# Patient Record
Sex: Male | Born: 1995 | Race: Black or African American | Hispanic: No | Marital: Single
Health system: Southern US, Community
[De-identification: ages and names within clinical notes are randomized; demographics above are authoritative.]

---

## 2016-10-21 ENCOUNTER — Emergency Department (HOSPITAL_COMMUNITY)
Admission: EM | Admit: 2016-10-21 | Discharge: 2016-10-21 | Disposition: A | Discharge status: Home / Self Care | Payer: BC Managed Care – PPO | Attending: Emergency Medicine | Admitting: Emergency Medicine

## 2016-10-21 ENCOUNTER — Emergency Department (HOSPITAL_COMMUNITY): Payer: BC Managed Care – PPO

## 2016-10-21 ENCOUNTER — Encounter (HOSPITAL_COMMUNITY): Payer: Self-pay

## 2016-10-21 DIAGNOSIS — S50812A Abrasion of left forearm, initial encounter: Secondary | ICD-10-CM | POA: Insufficient documentation

## 2016-10-21 DIAGNOSIS — M545 Low back pain, unspecified: Secondary | ICD-10-CM

## 2016-10-21 DIAGNOSIS — Y9339 Activity, other involving climbing, rappelling and jumping off: Secondary | ICD-10-CM | POA: Diagnosis not present

## 2016-10-21 DIAGNOSIS — W19XXXA Unspecified fall, initial encounter: Secondary | ICD-10-CM

## 2016-10-21 DIAGNOSIS — W1839XA Other fall on same level, initial encounter: Secondary | ICD-10-CM | POA: Insufficient documentation

## 2016-10-21 DIAGNOSIS — Y92219 Unspecified school as the place of occurrence of the external cause: Secondary | ICD-10-CM | POA: Insufficient documentation

## 2016-10-21 DIAGNOSIS — M79642 Pain in left hand: Secondary | ICD-10-CM

## 2016-10-21 DIAGNOSIS — Y999 Unspecified external cause status: Secondary | ICD-10-CM | POA: Diagnosis not present

## 2016-10-21 DIAGNOSIS — M25532 Pain in left wrist: Secondary | ICD-10-CM | POA: Diagnosis not present

## 2016-10-21 DIAGNOSIS — M25552 Pain in left hip: Secondary | ICD-10-CM | POA: Diagnosis not present

## 2016-10-21 DIAGNOSIS — S59912A Unspecified injury of left forearm, initial encounter: Secondary | ICD-10-CM | POA: Diagnosis present

## 2016-10-21 DIAGNOSIS — S60512A Abrasion of left hand, initial encounter: Secondary | ICD-10-CM | POA: Insufficient documentation

## 2016-10-21 MED ORDER — METHOCARBAMOL 500 MG PO TABS: 500.0000 mg | ORAL_TABLET | Freq: Two times a day (BID) | ORAL | 0 refills | Status: AC

## 2016-10-21 MED ORDER — TRAMADOL HCL 50 MG PO TABS
50.0000 mg | ORAL_TABLET | Freq: Once | ORAL | Status: AC
  Administered 2016-10-21: 50 mg via ORAL
  Filled 2016-10-21: qty 1

## 2016-10-21 NOTE — ED Provider Notes (Signed)
Emergency Department Provider Note   I have reviewed the triage vital signs and the nursing notes.   HISTORY  Chief Complaint Fall   HPI Alexander Wood is a 20 y.o. male with no PMH presents to the emergency department for evaluation ofleft wrist, left hip, and lower back pain after mechanical fall. The patient is a hurdle or who miscalculated a jump and landed head first over the hurdle on his left side. His coaches at bedside reports that his left arm seemed to get pinned underneath him and it seemed like he hit his head fairly hard. No loss of consciousness. The patient is complaining of pain primarily in the lateral portion of his left hand, left hip, and lower back. He denies any pain in his neck or head. No altered mental status since the fall. No vomiting. He has not taken any medications.   History reviewed. No pertinent past medical history.  There are no active problems to display for this patient.   History reviewed. No pertinent surgical history.    Allergies Patient has no known allergies.  No family history on file.  Social History Social History  Substance Use Topics  . Smoking status: Not on file  . Smokeless tobacco: Not on file  . Alcohol use Not on file    Review of Systems  Constitutional: No fever/chills Eyes: No visual changes. ENT: No sore throat. Cardiovascular: Denies chest pain. Respiratory: Denies shortness of breath. Gastrointestinal: No abdominal pain.  No nausea, no vomiting.  No diarrhea.  No constipation. Genitourinary: Negative for dysuria. Musculoskeletal: Positive lower back pain, left hand/wrist pain, and left hip pain.  Skin: Negative for rash. Neurological: Negative for headaches, focal weakness or numbness.  10-point ROS otherwise negative.  ____________________________________________   PHYSICAL EXAM:  VITAL SIGNS: ED Triage Vitals  Enc Vitals Group     BP 10/21/16 1647 114/86     Pulse Rate 10/21/16 1647 67   Resp 10/21/16 1647 18     Temp 10/21/16 1647 98.1 F (36.7 C)     Temp Source 10/21/16 1647 Oral     SpO2 10/21/16 1647 100 %     Weight 10/21/16 1647 155 lb (70.3 kg)     Height 10/21/16 1647 5\' 8"  (1.727 m)     Pain Score 10/21/16 1646 10   Constitutional: Alert and oriented. Well appearing and in no acute distress. Eyes: Conjunctivae are normal. PERRL. EOMI. Head: Atraumatic. Nose: No congestion/rhinnorhea. Mouth/Throat: Mucous membranes are moist.  Oropharynx non-erythematous. Neck: No stridor. No cervical spine tenderness to palpation. Cardiovascular: Normal rate, regular rhythm. Good peripheral circulation. Grossly normal heart sounds.   Respiratory: Normal respiratory effort.  No retractions. Lungs CTAB. Gastrointestinal: Soft and nontender. No distention.  Musculoskeletal: Tenderness and swelling with scattered abrasions to the left hand and forearm. No snuff box tenderness. Limited ROM of the 4th and 5th digits on the left. Normal capillary refill. Normal sensation.  Tenderness to palpation over the anterior left hip. Tingling in the hip, back, and left leg with movement of the hip. No knee/ankle tenderness. Midline spine tenderness over the lumbar spine.  Neurologic:  Normal speech and language. No gross focal neurologic deficits are appreciated.  Skin:  Skin is warm, dry and intact. No rash noted.  ____________________________________________  RADIOLOGY  Dg Lumbar Spine Complete  Result Date: 10/21/2016 CLINICAL DATA:  Initial evaluation for acute trauma, fall. Low back pain. EXAM: LUMBAR SPINE - COMPLETE 4+ VIEW COMPARISON:  None. FINDINGS: There is no evidence  of lumbar spine fracture. Alignment is normal. Intervertebral disc spaces are maintained. IMPRESSION: Negative. Electronically Signed   By: Rise Mu M.D.   On: 10/21/2016 17:55   Dg Wrist Complete Left  Result Date: 10/21/2016 CLINICAL DATA:  Jumpnig hurdles at school and fell on left hand. EXAM: LEFT  WRIST - COMPLETE 3+ VIEW COMPARISON:  None. FINDINGS: There is no evidence of fracture or dislocation. There is no evidence of arthropathy or other focal bone abnormality. Soft tissues are unremarkable. IMPRESSION: Negative. Electronically Signed   By: Kennith Center M.D.   On: 10/21/2016 17:55   Dg Hand Complete Left  Addendum Date: 10/21/2016   ADDENDUM REPORT: 10/21/2016 17:58 ADDENDUM: Clinical data should read "Jumping hurdles at school and landed on left hand. " Electronically Signed   By: Kennith Center M.D.   On: 10/21/2016 17:58   Result Date: 10/21/2016 CLINICAL DATA:  Jamming her moles at school and landed on left hand. EXAM: LEFT HAND - COMPLETE 3+ VIEW COMPARISON:  None. FINDINGS: There is no evidence of fracture or dislocation. There is no evidence of arthropathy or other focal bone abnormality. Soft tissues are unremarkable. IMPRESSION: Negative. Electronically Signed: By: Kennith Center M.D. On: 10/21/2016 17:54   Dg Hips Bilat With Pelvis 3-4 Views  Result Date: 10/21/2016 CLINICAL DATA:  Jumping hurdles at school and fell on left hip. EXAM: DG HIP (WITH OR WITHOUT PELVIS) 3-4V BILAT COMPARISON:  None. FINDINGS: There is no evidence of hip fracture or dislocation. There is no evidence of arthropathy or other focal bone abnormality. IMPRESSION: Negative. Electronically Signed   By: Kennith Center M.D.   On: 10/21/2016 17:57    ____________________________________________   PROCEDURES  Procedure(s) performed:   Procedures  None ____________________________________________   INITIAL IMPRESSION / ASSESSMENT AND PLAN / ED COURSE  Pertinent labs & imaging results that were available during my care of the patient were reviewed by me and considered in my medical decision making (see chart for details).  Patient resents emergency department for evaluation of left hand/wrist pain, left hip pain, lower back pain in the setting of ground level fall while jumping hurdles. The patient  did have some head and neck trauma but is not having pain in these areas. There is no sign of head injury on my exam. He has no midline cervical spine tenderness. Patient has scattered abrasions over his left hand but no laceration requiring repair. No evidence of open fracture. Plan for plain films of the hand, wrist, hip/pelvis, lumbar spine. Will give tramadol for pain. Will defer CT scan of the head and cervical spine given mechanism and no tenderness on the exam or other concerning symptoms.   06:09 PM Plain films negative. Patient able to ambulate in the emergency department with some residual stiffness but overall is moving well. He will follow up with team trainers prior to returning to running or football. Gave contact information for [facility] should pain continue beyond several weeks and require additional outpatient imaging. Will discharge home with Robaxin for msk strain and advise NSAIDs and continued daily activity as tolerated.   At this time, I do not feel there is any life-threatening condition present. I have reviewed and discussed all results (EKG, imaging, lab, urine as appropriate), exam findings with patient. I have reviewed nursing notes and appropriate previous records.  I feel the patient is safe to be discharged home without further emergent workup. Discussed usual and customary return precautions. Patient and family (if present) verbalize understanding  and are comfortable with this plan.  Patient will follow-up with their primary care provider. If they do not have a primary care provider, information for follow-up has been provided to them. All questions have been answered.  ____________________________________________  FINAL CLINICAL IMPRESSION(S) / ED DIAGNOSES  Final diagnoses:  Fall, initial encounter  Left hand pain  Acute left-sided low back pain without sciatica  Left hip pain     MEDICATIONS GIVEN DURING THIS VISIT:  Medications  traMADol  (ULTRAM) tablet 50 mg (50 mg Oral Given 10/21/16 1756)     NEW OUTPATIENT MEDICATIONS STARTED DURING THIS VISIT:  New Prescriptions   METHOCARBAMOL (ROBAXIN) 500 MG TABLET    Take 1 tablet (500 mg total) by mouth 2 (two) times daily.      Note:  This document was prepared using Dragon voice recognition software and may include unintentional dictation errors.  Alona BeneJoshua Long, MD Emergency Medicine   Maia PlanJoshua G Long, MD 10/21/16 [phone]

## 2016-10-21 NOTE — Discharge Instructions (Signed)

## 2016-10-21 NOTE — ED Triage Notes (Addendum)
Patient here following fall while jumping hurdles. Complains of bilateral hip pain and left hand pain, no loc. Alert and oriented.states unable to ambulate due to the pain

## 2017-12-29 ENCOUNTER — Other Ambulatory Visit: Payer: Self-pay | Admitting: Family Medicine

## 2017-12-29 DIAGNOSIS — N489 Disorder of penis, unspecified: Secondary | ICD-10-CM

## 2017-12-29 DIAGNOSIS — N4889 Other specified disorders of penis: Secondary | ICD-10-CM

## 2018-01-03 ENCOUNTER — Ambulatory Visit
Admission: RE | Admit: 2018-01-03 | Discharge: 2018-01-03 | Disposition: A | Discharge status: Home / Self Care | Payer: BC Managed Care – PPO | Source: Ambulatory Visit | Attending: Family Medicine | Admitting: Family Medicine

## 2018-01-03 DIAGNOSIS — N4889 Other specified disorders of penis: Secondary | ICD-10-CM

## 2018-01-03 DIAGNOSIS — N489 Disorder of penis, unspecified: Secondary | ICD-10-CM

## 2018-03-20 IMAGING — CR DG HAND COMPLETE 3+V*L*
3 series · 3 of 3 positions shown · non-contrast
Comparison: None.

ADDENDUM:
Clinical data should read "Jumping hurdles at school and landed on
left hand. "
CLINICAL DATA: Jamming her moles at school and landed on left hand.

EXAM:
LEFT HAND - COMPLETE 3+ VIEW

[hand pa]
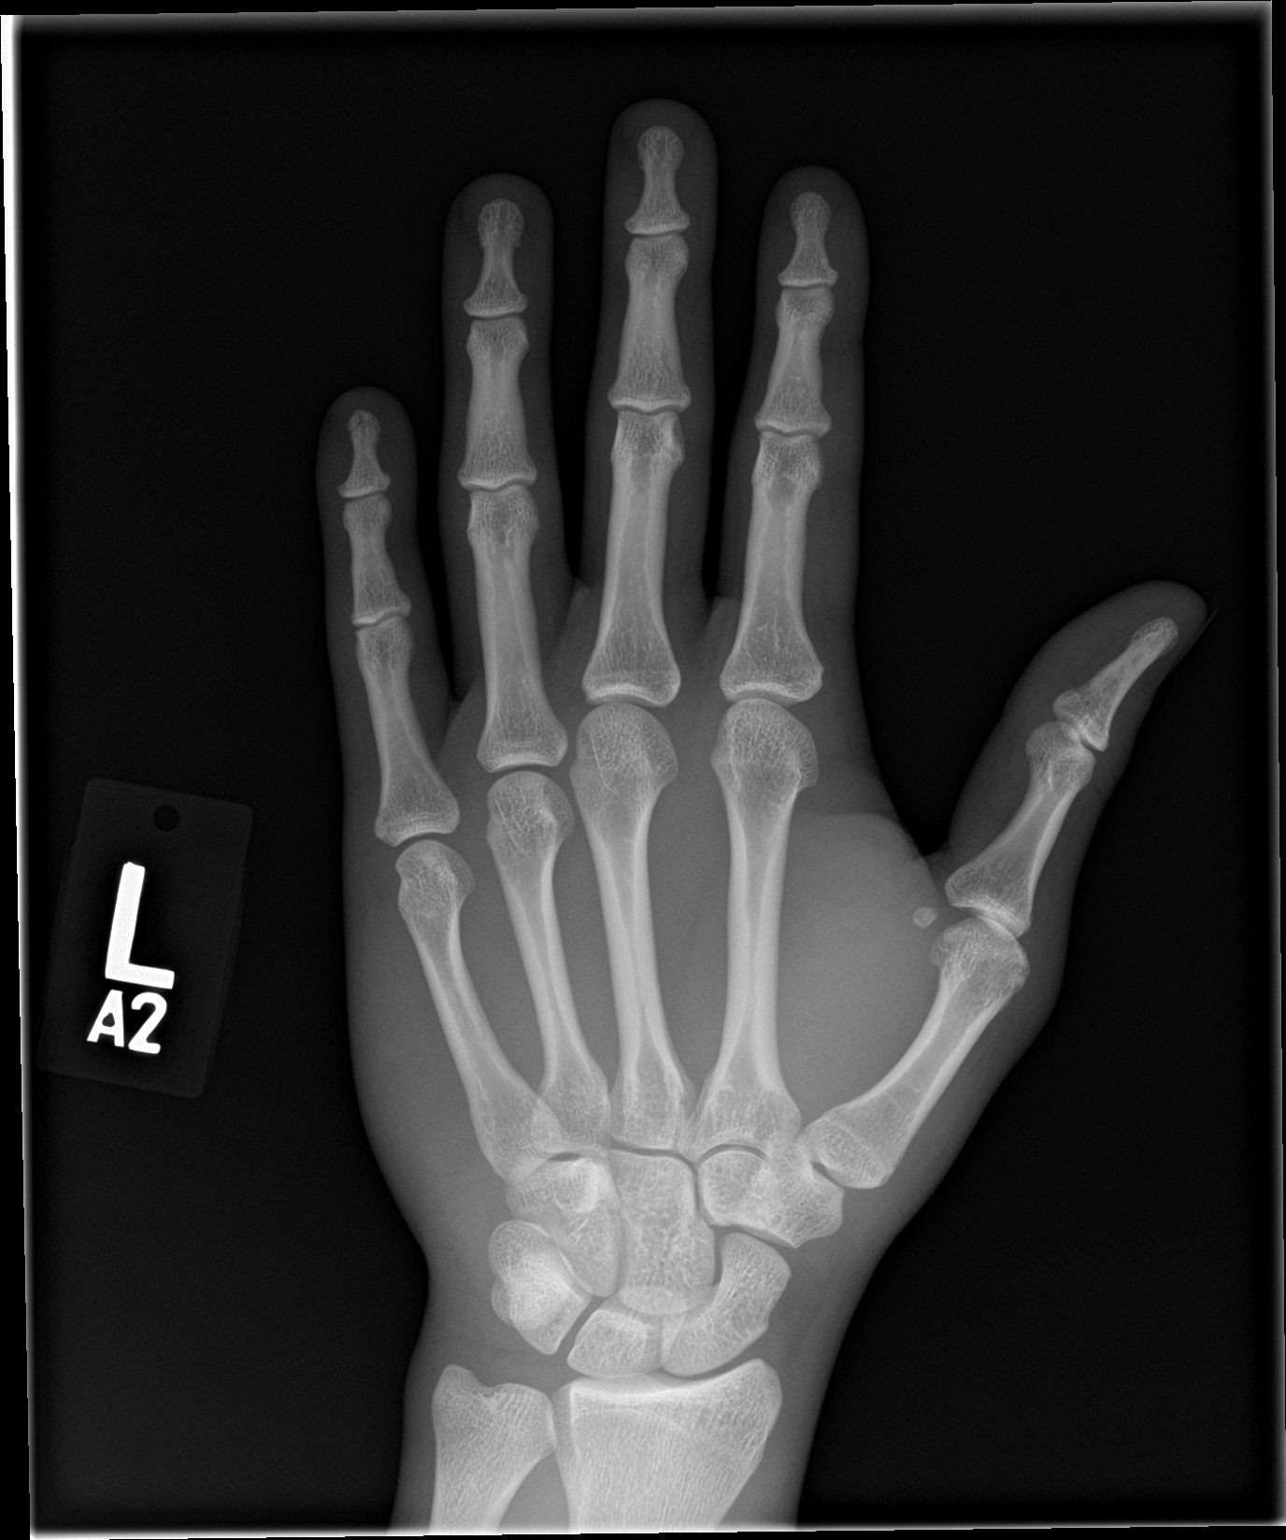

[hand obl]
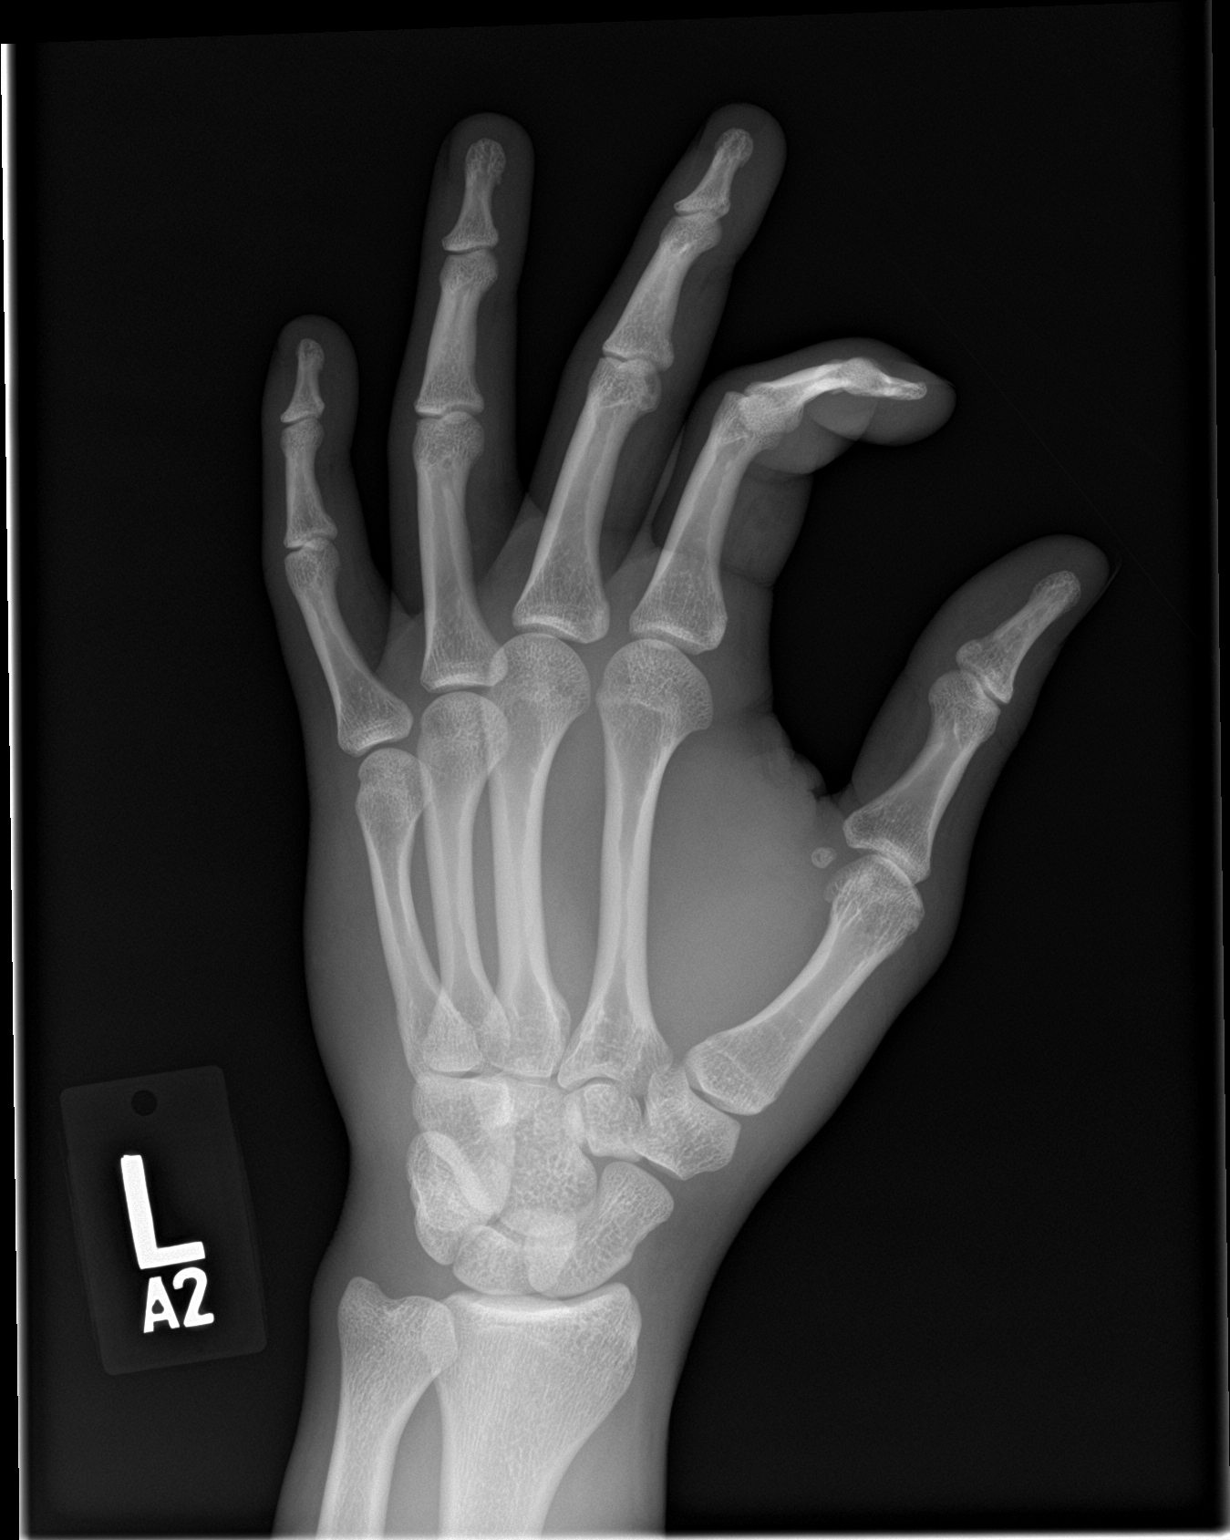

[hand lat]
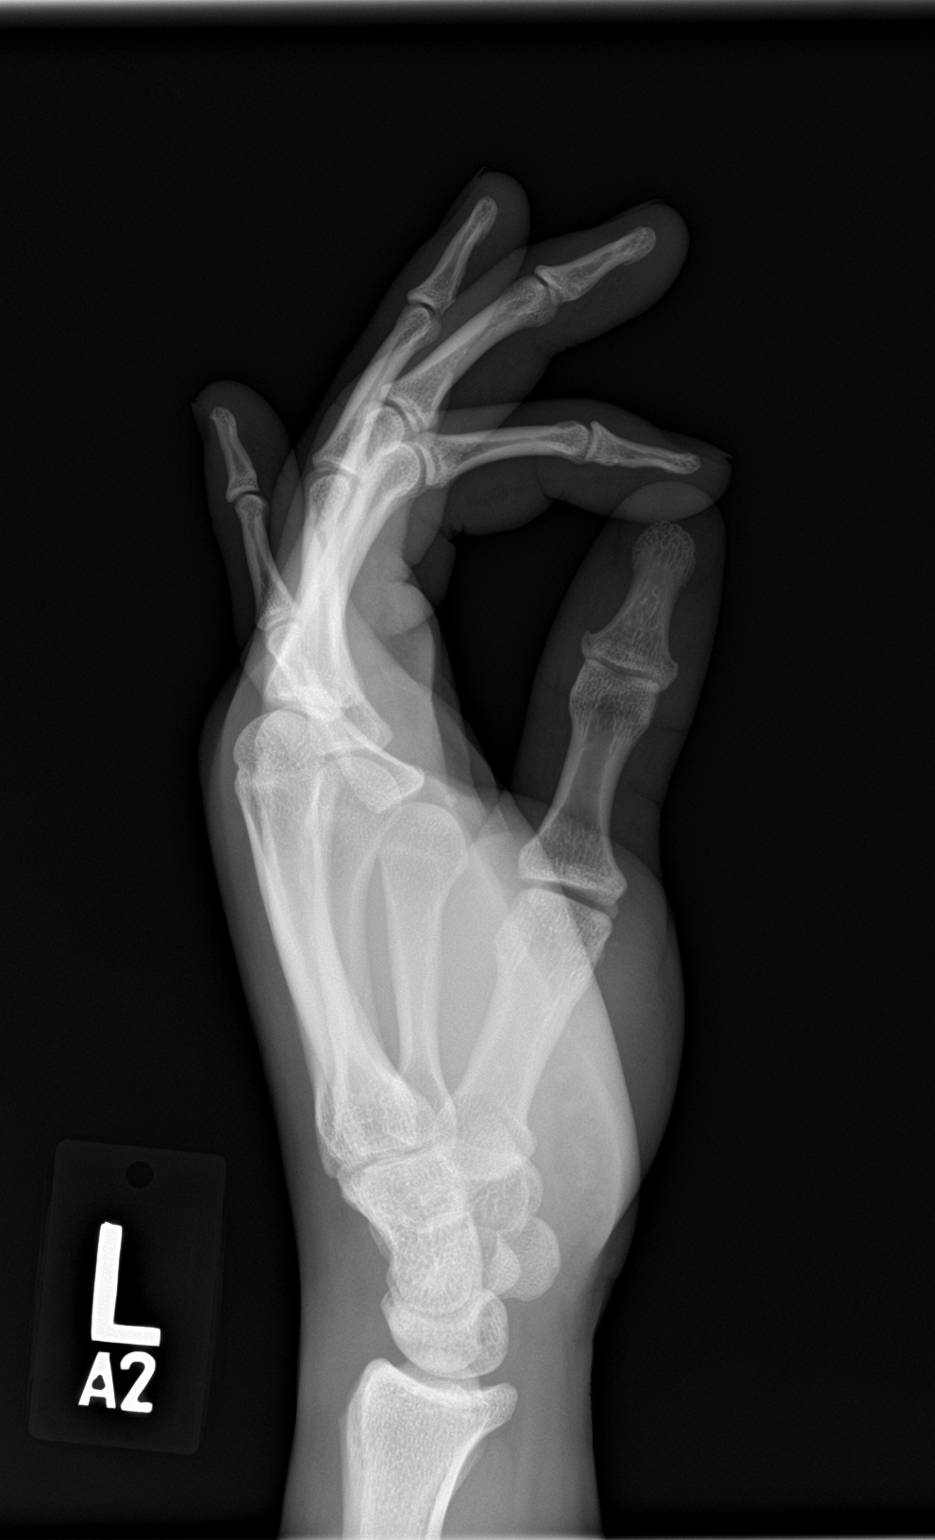

[3 of 3 positions shown; findings below may reference images not displayed]

FINDINGS: There is no evidence of fracture or dislocation. There is no
evidence of arthropathy or other focal bone abnormality. Soft
tissues are unremarkable.
IMPRESSION: Negative.

## 2019-09-02 IMAGING — US US SCROTUM W/ DOPPLER COMPLETE
1 series · 14 of 25 positions shown · non-contrast
Comparison: None.

CLINICAL DATA: Left tenderness at the base of the penis and groin

EXAM:
SCROTAL ULTRASOUND
DOPPLER ULTRASOUND OF THE TESTICLES
TECHNIQUE: Complete ultrasound examination of the testicles, epididymis, and
other scrotal structures was performed. Color and spectral Doppler
ultrasound were also utilized to evaluate blood flow to the
testicles.

[Series 1: us scrotum w/ doppler complete · 0.08mm/px · 14 of 55 slices shown]
[im 1/55]
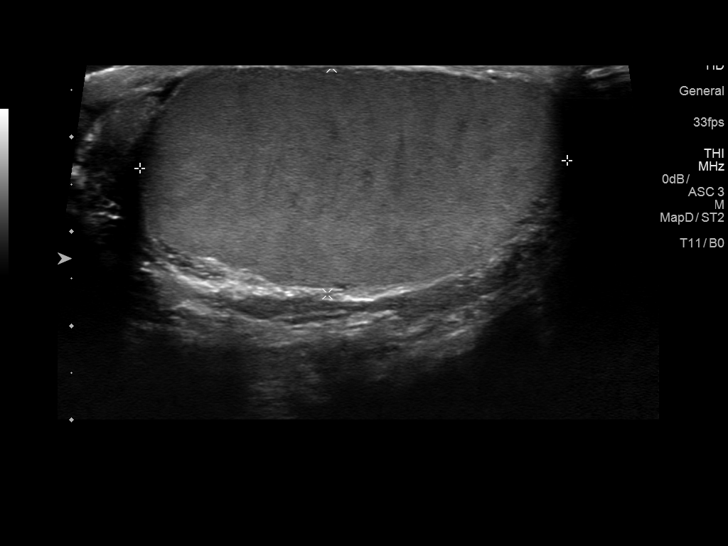
[im 5/55]
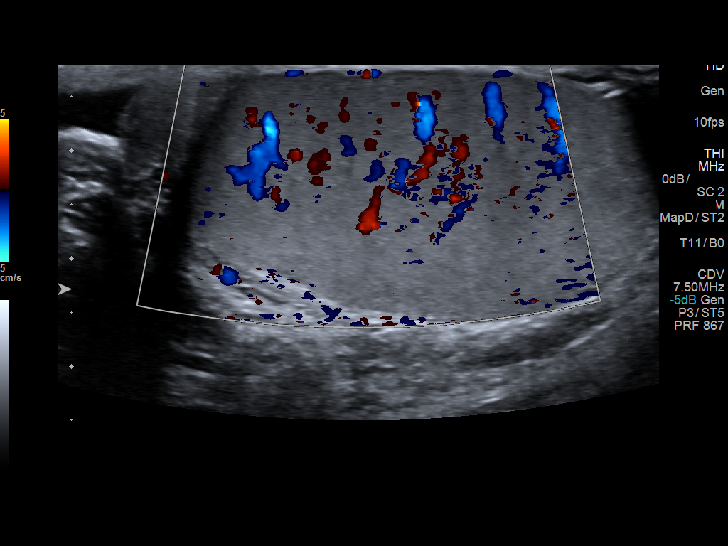
[im 10/55]
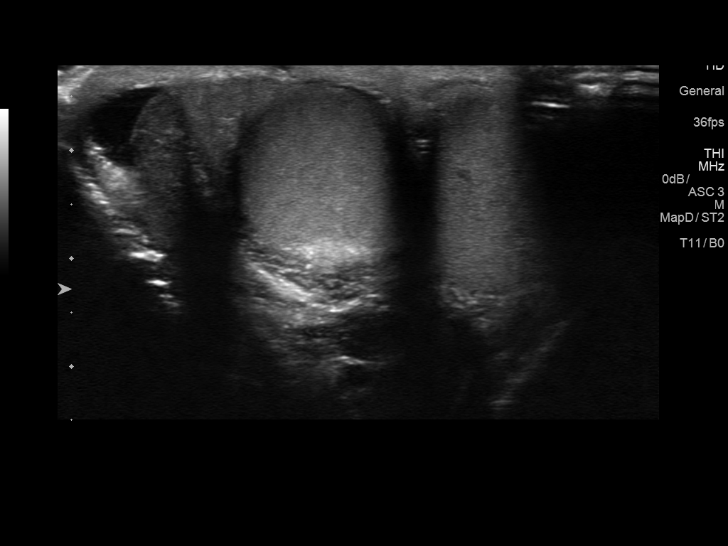
[im 14/55]
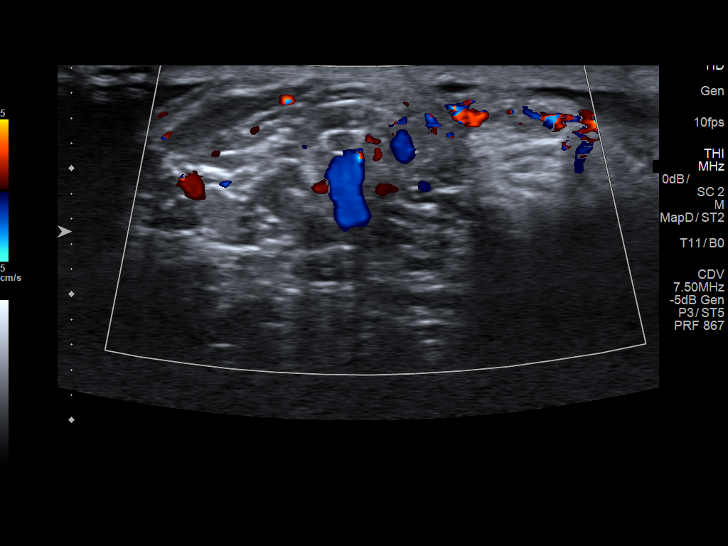
[im 19/55]
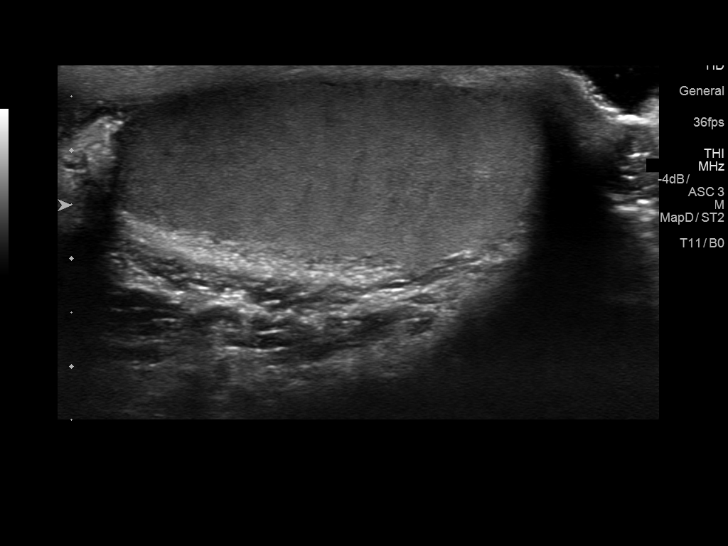
[im 21/55]
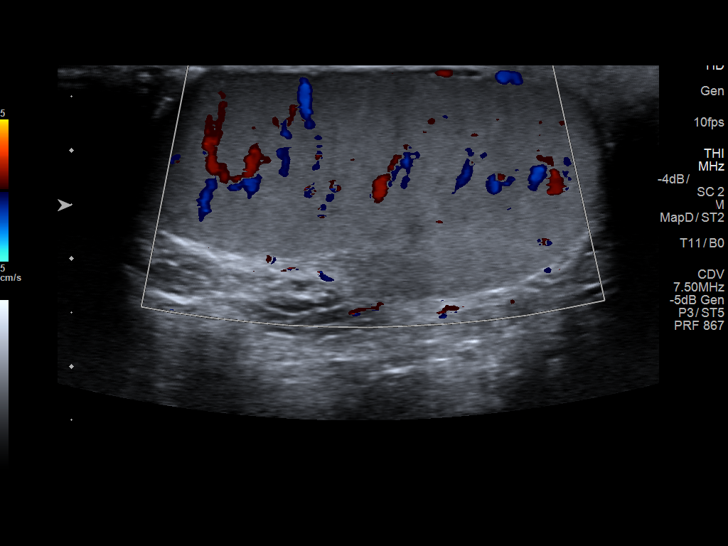
[im 25/55]
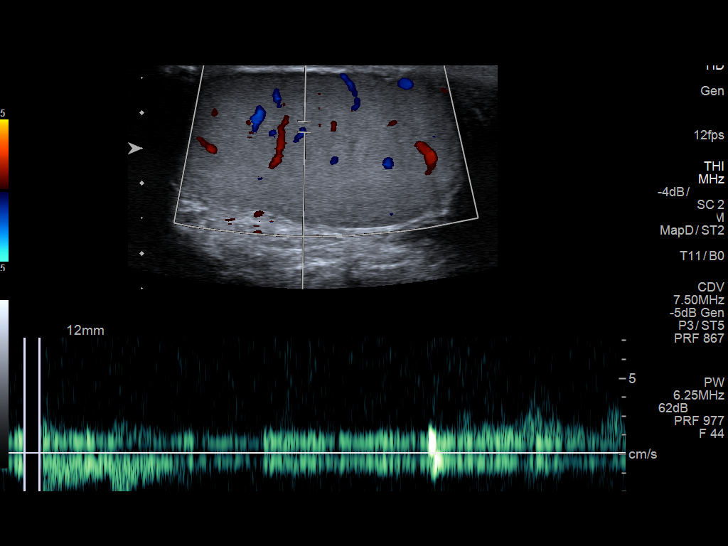
[im 30/55]
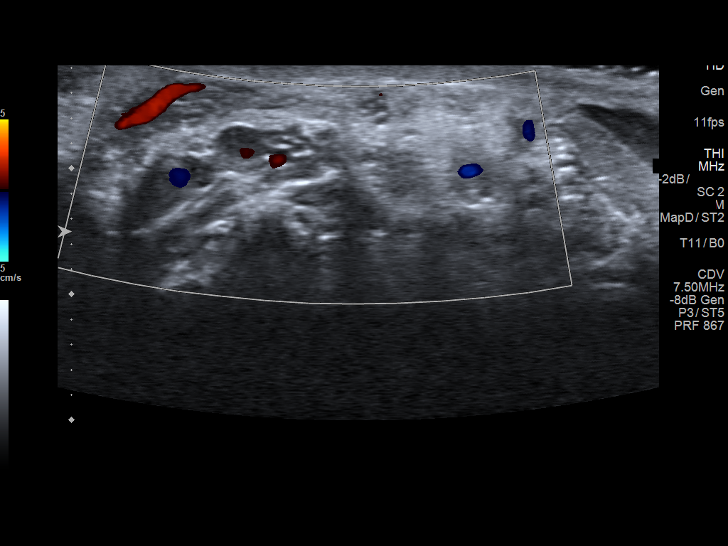
[im 34/55]
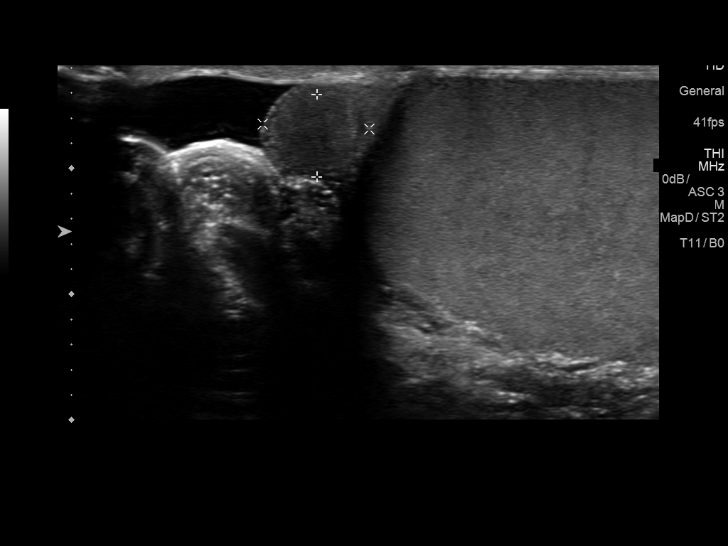
[im 37/55]
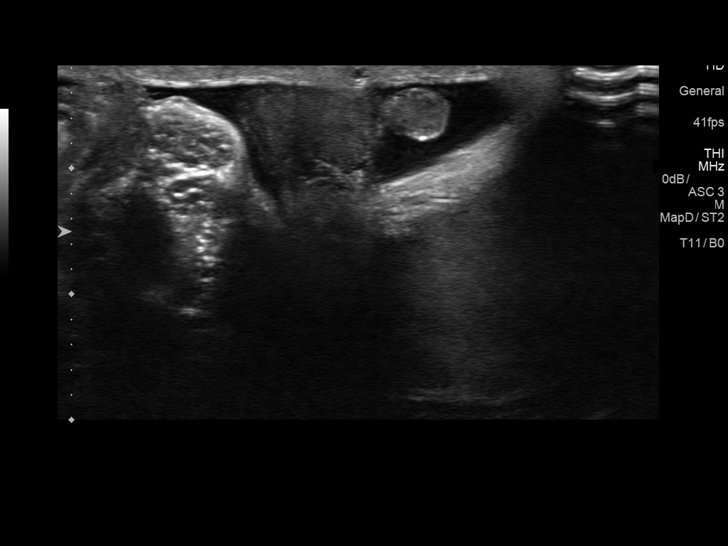
[im 41/55]
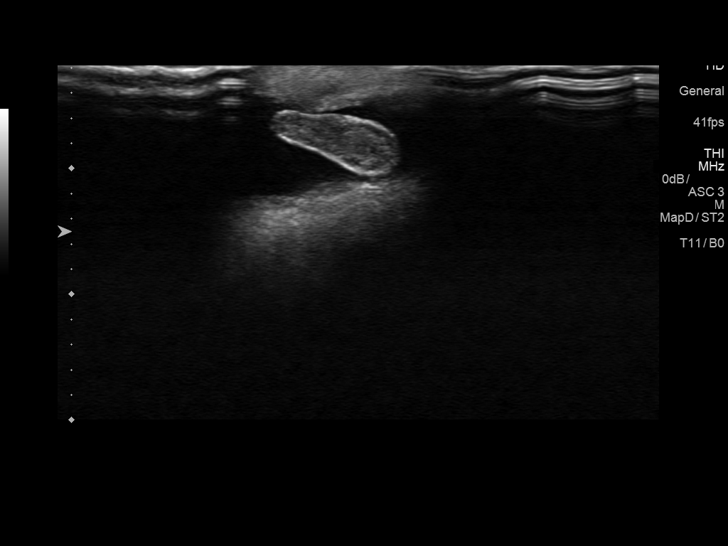
[im 46/55]
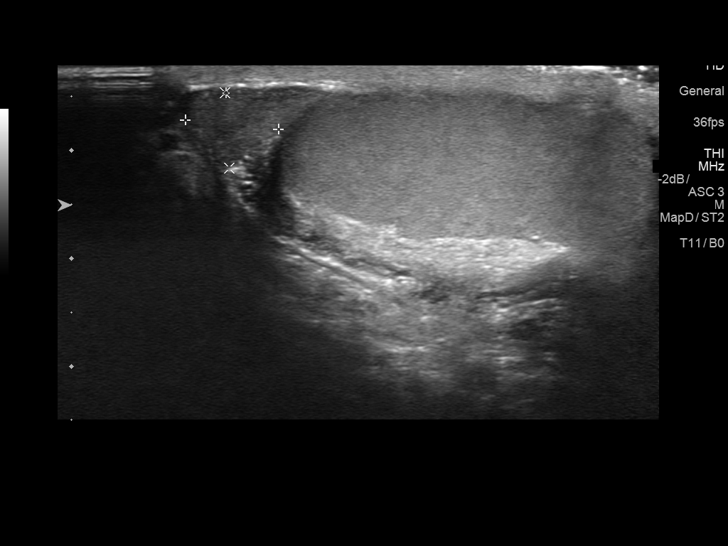
[im 50/55]
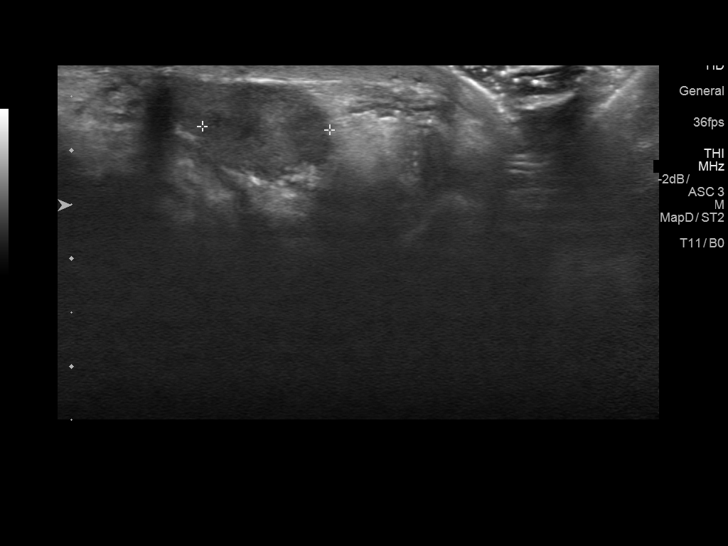
[im 55/55]
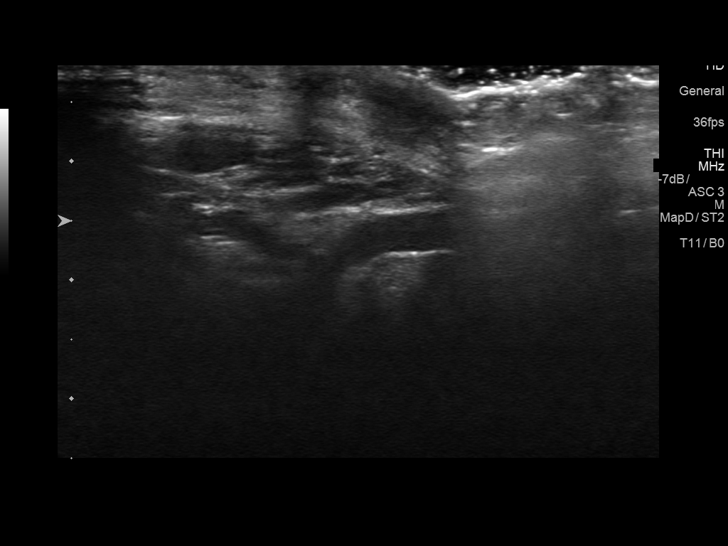

[14 of 25 positions shown; findings below may reference images not displayed]

FINDINGS: Right testicle

Measurements: 4.5 x 2.4 x 2.5 cm. No mass or microlithiasis
visualized. Testicular appendage on the right measuring 1.1 cm.

Left testicle

Measurements: 4.7 x 2.1 x 2.9 cm. No mass or microlithiasis
visualized.

Right epididymis:  Normal in size and appearance.

Left epididymis:  Normal in size and appearance.

Hydrocele:  Small right hydrocele

Varicocele:  None visualized.

Pulsed Doppler interrogation of both testes demonstrates normal low
resistance arterial and venous waveforms bilaterally.

No sonographic abnormality in the region of suprapubic left pain.
IMPRESSION: 1. Negative for testicular torsion or testicular mass lesion.
2. Small right hydrocele
